# Patient Record
Sex: Female | Born: 1940 | Race: White | Hispanic: No | Marital: Married | Smoking: Never smoker
Health system: Southern US, Community
[De-identification: ages and names within clinical notes are randomized; demographics above are authoritative.]

---

## 2001-04-22 ENCOUNTER — Other Ambulatory Visit: Admission: RE | Admit: 2001-04-22 | Discharge: 2001-04-22 | Payer: Self-pay | Admitting: Orthopedic Surgery

## 2004-12-23 ENCOUNTER — Ambulatory Visit: Payer: Self-pay | Admitting: Internal Medicine

## 2006-04-03 ENCOUNTER — Encounter: Admission: RE | Admit: 2006-04-03 | Discharge: 2006-04-03 | Payer: Self-pay | Admitting: Orthopaedic Surgery

## 2007-04-10 ENCOUNTER — Encounter: Admission: RE | Admit: 2007-04-10 | Discharge: 2007-04-10 | Payer: Self-pay | Admitting: Orthopaedic Surgery

## 2008-04-24 ENCOUNTER — Encounter: Admission: RE | Admit: 2008-04-24 | Discharge: 2008-04-24 | Payer: Self-pay | Admitting: Orthopaedic Surgery

## 2008-05-29 ENCOUNTER — Encounter: Admission: RE | Admit: 2008-05-29 | Discharge: 2008-05-29 | Payer: Self-pay | Admitting: Orthopaedic Surgery

## 2008-06-12 ENCOUNTER — Encounter: Admission: RE | Admit: 2008-06-12 | Discharge: 2008-06-12 | Payer: Self-pay | Admitting: Orthopaedic Surgery

## 2008-06-26 ENCOUNTER — Encounter: Admission: RE | Admit: 2008-06-26 | Discharge: 2008-06-26 | Payer: Self-pay | Admitting: Orthopaedic Surgery

## 2009-02-03 ENCOUNTER — Encounter: Admission: RE | Admit: 2009-02-03 | Discharge: 2009-02-03 | Payer: Self-pay

## 2009-10-19 ENCOUNTER — Ambulatory Visit (HOSPITAL_BASED_OUTPATIENT_CLINIC_OR_DEPARTMENT_OTHER): Admission: RE | Admit: 2009-10-19 | Discharge: 2009-10-19 | Payer: Self-pay | Admitting: Orthopedic Surgery

## 2011-02-14 LAB — POCT HEMOGLOBIN-HEMACUE: Hemoglobin: 12.2 g/dL (ref 12.0–15.0)

## 2011-02-14 LAB — BASIC METABOLIC PANEL
BUN: 34 mg/dL — ABNORMAL HIGH (ref 6–23)
Chloride: 103 mEq/L (ref 96–112)
Creatinine, Ser: 1.6 mg/dL — ABNORMAL HIGH (ref 0.4–1.2)
Glucose, Bld: 89 mg/dL (ref 70–99)
Potassium: 4 mEq/L (ref 3.5–5.1)
Sodium: 139 mEq/L (ref 135–145)

## 2012-02-20 ENCOUNTER — Other Ambulatory Visit: Payer: Self-pay | Admitting: Orthopedic Surgery

## 2012-02-22 NOTE — H&P (Signed)
   Ms. Maners returns for follow-up evaluation of two predicaments affecting her right hand.   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 She has some nail pressure deformity.   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  She is still able to flex from a 5 degree flexion contracture to further flexion 90 degrees. She does have reactive synovitis.    Her past history is updated.  Her family history, social history and review of systems are otherwise unchanged.  We last saw her for evaluation in February of 2012.    X-rays of her right thumb AP and lateral demonstrate advanced osteoarthritis with bone-on-bone arthropathy and large marginal osteophytes.  She likely has several loose bodies.  X-ray of her finger, four views demonstrates bone-on-bone arthropathy with at least a 15 degree ulnar deviation at the PIP joint.  PLAN:  After informed consent and alcohol/Betadine prep she was injected with Depo Medrol and Lidocaine into her ring finger PIP joint with a goal of pain palliation.  With respect to the thumb we discussed debridement  of the mycoid cyst at a mutually convenient time under local anesthesia.  She understands that if she develops pain from her osteoarthritis in the thumb, arthrodesis in the IP joint would be indicated.  Questions were invited and answered in detail.    H&P documentation: 02/23/2012  -History and Physical Reviewed  -Patient has been re-examined  -No change in the plan of care  Wyn Forster, MD

## 2012-02-22 NOTE — Discharge Instructions (Signed)

## 2012-02-23 ENCOUNTER — Ambulatory Visit (HOSPITAL_BASED_OUTPATIENT_CLINIC_OR_DEPARTMENT_OTHER)
Admission: RE | Admit: 2012-02-23 | Discharge: 2012-02-23 | Disposition: A | Discharge status: Home / Self Care | Payer: Medicare Other | Source: Ambulatory Visit | Attending: Orthopedic Surgery | Admitting: Orthopedic Surgery

## 2012-02-23 ENCOUNTER — Encounter (HOSPITAL_BASED_OUTPATIENT_CLINIC_OR_DEPARTMENT_OTHER)
Admission: RE | Disposition: A | Discharge status: Home / Self Care | Payer: Self-pay | Source: Ambulatory Visit | Attending: Orthopedic Surgery

## 2012-02-23 DIAGNOSIS — M6740 Ganglion, unspecified site: Secondary | ICD-10-CM | POA: Insufficient documentation

## 2012-02-23 DIAGNOSIS — M19049 Primary osteoarthritis, unspecified hand: Secondary | ICD-10-CM | POA: Insufficient documentation

## 2012-02-23 HISTORY — PX: MASS EXCISION: SHX2000

## 2012-02-23 SURGERY — MINOR EXCISION OF MASS
Anesthesia: LOCAL | Site: Thumb | Laterality: Right | Wound class: Clean

## 2012-02-23 MED ORDER — HYDROCODONE-ACETAMINOPHEN 5-325 MG PO TABS
ORAL_TABLET | ORAL | Status: AC
Start: 2012-02-23 — End: 2012-03-04

## 2012-02-23 MED ORDER — LIDOCAINE HCL 2 % IJ SOLN
INTRAMUSCULAR | Status: DC | PRN
  Administered 2012-02-23: 5 mL

## 2012-02-23 MED ORDER — CHLORHEXIDINE GLUCONATE 4 % EX LIQD: 60.0000 mL | Freq: Once | CUTANEOUS | Status: DC

## 2012-02-23 MED ORDER — CEPHALEXIN 500 MG PO CAPS: 500.0000 mg | ORAL_CAPSULE | Freq: Three times a day (TID) | ORAL | Status: AC

## 2012-02-23 SURGICAL SUPPLY — 36 items
BANDAGE ADHESIVE 1X3 (GAUZE/BANDAGES/DRESSINGS) IMPLANT
BLADE SURG 15 STRL LF DISP TIS (BLADE) ×1 IMPLANT
BLADE SURG 15 STRL SS (BLADE) ×2
BNDG CMPR 9X4 STRL LF SNTH (GAUZE/BANDAGES/DRESSINGS)
BNDG CMPR MD 5X2 ELC HKLP STRL (GAUZE/BANDAGES/DRESSINGS) ×1
BNDG COHESIVE 1X5 TAN STRL LF (GAUZE/BANDAGES/DRESSINGS) ×1 IMPLANT
BNDG ELASTIC 2 VLCR STRL LF (GAUZE/BANDAGES/DRESSINGS) ×1 IMPLANT
BNDG ESMARK 4X9 LF (GAUZE/BANDAGES/DRESSINGS) IMPLANT
BRUSH SCRUB EZ PLAIN DRY (MISCELLANEOUS) ×2 IMPLANT
CLOTH BEACON ORANGE TIMEOUT ST (SAFETY) ×2 IMPLANT
CORDS BIPOLAR (ELECTRODE) IMPLANT
COVER MAYO STAND STRL (DRAPES) ×2 IMPLANT
CUFF TOURNIQUET SINGLE 18IN (TOURNIQUET CUFF) IMPLANT
DECANTER SPIKE VIAL GLASS SM (MISCELLANEOUS) IMPLANT
DRAIN PENROSE 1/2X12 LTX STRL (WOUND CARE) ×1 IMPLANT
DRAPE SURG 17X23 STRL (DRAPES) ×2 IMPLANT
GAUZE SPONGE 4X4 12PLY STRL LF (GAUZE/BANDAGES/DRESSINGS) ×4 IMPLANT
GAUZE XEROFORM 1X8 LF (GAUZE/BANDAGES/DRESSINGS) ×1 IMPLANT
GLOVE BIO SURGEON STRL SZ 6.5 (GLOVE) ×2 IMPLANT
GLOVE BIOGEL M STRL SZ7.5 (GLOVE) ×2 IMPLANT
GLOVE ORTHO TXT STRL SZ7.5 (GLOVE) ×2 IMPLANT
GOWN PREVENTION PLUS XLARGE (GOWN DISPOSABLE) ×4 IMPLANT
NEEDLE 27GAX1X1/2 (NEEDLE) ×1 IMPLANT
PACK BASIN DAY SURGERY FS (CUSTOM PROCEDURE TRAY) ×2 IMPLANT
PADDING CAST ABS 4INX4YD NS (CAST SUPPLIES) ×1
PADDING CAST ABS COTTON 4X4 ST (CAST SUPPLIES) ×1 IMPLANT
SPONGE GAUZE 4X4 12PLY (GAUZE/BANDAGES/DRESSINGS) ×2 IMPLANT
STOCKINETTE 4X48 STRL (DRAPES) ×2 IMPLANT
SUT ETHILON 5 0 P 3 18 (SUTURE) ×1
SUT NYLON ETHILON 5-0 P-3 1X18 (SUTURE) ×1 IMPLANT
SYR 3ML 23GX1 SAFETY (SYRINGE) IMPLANT
SYR CONTROL 10ML LL (SYRINGE) ×1 IMPLANT
TOWEL OR 17X24 6PK STRL BLUE (TOWEL DISPOSABLE) ×4 IMPLANT
TRAY DSU PREP LF (CUSTOM PROCEDURE TRAY) ×2 IMPLANT
UNDERPAD 30X30 INCONTINENT (UNDERPADS AND DIAPERS) ×2 IMPLANT
WATER STERILE IRR 1000ML POUR (IV SOLUTION) ×2 IMPLANT

## 2012-02-23 NOTE — Op Note (Signed)
OP NOTE DICTATED [id number]

## 2012-02-23 NOTE — Brief Op Note (Signed)
02/23/2012  11:12 AM  PATIENT:  Jacqueline Price  71 y.o. 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

## 2012-02-26 ENCOUNTER — Encounter (HOSPITAL_BASED_OUTPATIENT_CLINIC_OR_DEPARTMENT_OTHER): Payer: Self-pay | Admitting: Orthopedic Surgery

## 2012-02-26 NOTE — Op Note (Signed)
NAMEONESHA, KREBBS NO.:  MEDICAL RECORD NO.:  [id number]  LOCATION:                                 FACILITY:  PHYSICIAN:  Katy Fitch. Terria Deschepper, M.D.      DATE OF BIRTH:  DATE OF PROCEDURE:  02/23/2012 DATE OF DISCHARGE:                              OPERATIVE REPORT   PREOPERATIVE DIAGNOSIS:  Large mass, dorsal radial aspect of right thumb with advanced osteoarthritis of the right thumb interphalangeal joint, radial deviation and bone-on-bone arthropathy.  POSTOPERATIVE DIAGNOSIS:  Mucoid cyst with large marginal osteophytes of the proximal phalangeal head and distal phalangeal base, right thumb interphalangeal joint.  OPERATION:  Arthrotomy of right thumb interphalangeal joint with removal of marginal osteophytes at the proximal phalangeal head, base of distal phalanx, and synovectomy of interphalangeal joint, also incidental excision of mucoid cyst.  OPERATING SURGEON:  Katy Fitch. Junie Avilla, MD  ASSISTANT:  Marveen Reeks Dasnoit, PA-C  ANESTHESIA:  2% lidocaine metacarpal head level block of right thumb. Total volume is 5 mL of 2% lidocaine without epinephrine.  ANESTHETIST:  Katy Fitch. Nicolette Gieske, MD  This was performed as a minor operating room procedure.  PROCEDURE:  Tereza Gilham was brought to room 1 of the [facility] and placed in supine position on the operating table.  Following informed consent and Betadine prep, 2% lidocaine was infiltrated at metacarpal head level to obtain a digital block of the thumb.  After 5 minutes, excellent anesthesia was achieved.  The right arm and hand were prepped with Betadine soap and solution and sterilely draped.  Following routine surgical time-out, the thumb was exsanguinated with a gauze wrap and a half-inch Penrose drain placed over the proximal phalangeal segment as a digital tourniquet.  Procedure commenced with curvilinear incision exposing the mass and mucoid cyst.  A triangular portion of  the capsule between the distal phalanx and proximal phalangeal head was excised between the radial collateral ligament and the extensor tendon.  The mucoid cyst distally was identified and removed with a rongeur.  A capsulotomy was performed revealing a very large radial osteophyte at the proximal phalangeal head and at the base of the distal phalanx.  The marginal osteophytes were resected with a rongeur followed by thorough synovectomy of the interphalangeal joint.  Multiple loose bodies removed.  The joint was then thoroughly irrigated with sterile saline utilizing a 19-gauge blunt dental needle and approximately 25 mL of saline.  Bleeding was not problematic.  The wound was then repaired with trauma sutures of 5-0 nylon.  For aftercare, Ms. Deshong was provided prescriptions for hydrocodone 5 mg 1 p.o. q.4-6 h. p.r.n. pain, 20 tablets without refill; also Keflex 500 mg 1 p.o. q.8 h. x4 days as a prophylactic antibiotic due to joint entry.     Katy Fitch Emaline Karnes, M.D.     RVS/MEDQ  D:  02/23/2012  T:  02/23/2012  Job:  [id number]

## 2012-04-01 ENCOUNTER — Other Ambulatory Visit: Payer: Self-pay | Admitting: Dermatology

## 2014-07-26 ENCOUNTER — Ambulatory Visit (INDEPENDENT_AMBULATORY_CARE_PROVIDER_SITE_OTHER): Payer: 59 | Admitting: Family Medicine

## 2014-07-26 ENCOUNTER — Ambulatory Visit (INDEPENDENT_AMBULATORY_CARE_PROVIDER_SITE_OTHER): Payer: 59

## 2014-07-26 VITALS — BP 130/70 | HR 56 | Temp 97.7°F | Resp 20 | Ht 63.0 in | Wt 158.8 lb

## 2014-07-26 DIAGNOSIS — S01501A Unspecified open wound of lip, initial encounter: Secondary | ICD-10-CM

## 2014-07-26 DIAGNOSIS — S6992XA Unspecified injury of left wrist, hand and finger(s), initial encounter: Secondary | ICD-10-CM

## 2014-07-26 DIAGNOSIS — S6990XA Unspecified injury of unspecified wrist, hand and finger(s), initial encounter: Secondary | ICD-10-CM

## 2014-07-26 DIAGNOSIS — S62329A Displaced fracture of shaft of unspecified metacarpal bone, initial encounter for closed fracture: Secondary | ICD-10-CM

## 2014-07-26 MED ORDER — TRAMADOL HCL 50 MG PO TABS: 50.0000 mg | ORAL_TABLET | Freq: Three times a day (TID) | ORAL | Status: AC | PRN

## 2014-07-26 MED ORDER — METHOCARBAMOL 500 MG PO TABS: 500.0000 mg | ORAL_TABLET | Freq: Four times a day (QID) | ORAL | Status: AC | PRN

## 2014-07-26 NOTE — Patient Instructions (Signed)
Hand Fracture, Metacarpals  Fractures of metacarpals are breaks in the bones of the hand. They extend from the knuckles to the wrist. These bones can undergo many types of fractures. There are different ways of treating these fractures, all of which may be correct.  TREATMENT   Hand fractures can be treated with:    Non-reduction - The fracture is casted without changing the positions of the fracture (bone pieces) involved. This fracture is usually left in a cast for 4 to 6 weeks or as your caregiver thinks necessary.   Closed reduction - The bones are moved back into position without surgery and then casted.   ORIF (open reduction and internal fixation) - The fracture site is opened and the bone pieces are fixed into place with some type of hardware, such as screws, etc. They are then casted.  Your caregiver will discuss the type of fracture you have and the treatment that should be best for that problem. If surgery is chosen, let your caregivers know about the following.   LET YOUR CAREGIVERS KNOW ABOUT:   Allergies.   Medications you are taking, including herbs, eye drops, over the counter medications, and creams.   Use of steroids (by mouth or creams).   Previous problems with anesthetics or novocaine.   Possibility of pregnancy.   History of blood clots (thrombophlebitis).   History of bleeding or blood problems.   Previous surgeries.   Other health problems.  AFTER THE PROCEDURE  After surgery, you will be taken to the recovery area where a nurse will watch and check your progress. Once you are awake, stable, and taking fluids well, barring other problems, you'll be allowed to go home. Once home, an ice pack applied to your operative site may help with pain and keep the swelling down.  HOME CARE INSTRUCTIONS    Follow your caregiver's instructions as to activities, exercises, physical therapy, and driving a car.   Daily exercise is helpful for keeping range of motion and strength. Exercise as  instructed.   To lessen swelling, keep the injured hand elevated above the level of your heart as much as possible.   Apply ice to the injury for 15-20 minutes each hour while awake for the first 2 days. Put the ice in a plastic bag and place a thin towel between the bag of ice and your cast.   Move the fingers of your casted hand several times a day.   If a plaster or fiberglass cast was applied:   Do not try to scratch the skin under the cast using a sharp or pointed object.   Check the skin around the cast every day. You may put lotion on red or sore areas.   Keep your cast dry. Your cast can be protected during bathing with a plastic bag. Do not put your cast into the water.   If a plaster splint was applied:   Wear your splint for as long as directed by your caregiver or until seen again.   Do not get your splint wet. Protect it during bathing with a plastic bag.   You may loosen the elastic bandage around the splint if your fingers start to get numb, tingle, get cold or turn blue.   Do not put pressure on your cast or splint; this may cause it to break. Especially, do not lean plaster casts on hard surfaces for 24 hours after application.   Take medications as directed by your caregiver.   Only   pain. SEEK MEDICAL CARE IF:   Increased bleeding (more than a small spot) from beneath your cast or splint if there is beneath the cast as with an open reduction.  Redness, swelling, or increasing pain in the wound or from beneath your cast or splint.  Pus coming from wound or from beneath your cast or splint.  An unexplained oral temperature above 102 F (38.9 C) develops, or as your caregiver suggests.  A foul smell coming from the wound or dressing or from  beneath your cast or splint.  You have a problem moving any of your fingers. SEEK IMMEDIATE MEDICAL CARE IF:   You develop a rash  You have difficulty breathing  You have any allergy problems If you do not have a window in your cast for observing the wound, a discharge or minor bleeding may show up as a stain on the outside of your cast. Report these findings to your caregiver. MAKE SURE YOU:   Understand these instructions.  Will watch your condition.  Will get help right away if you are not doing well or get worse. Document Released: 10/30/2005 Document Revised: 01/22/2012 Document Reviewed: 06/18/2008 [facility] Patient Information 2015 [place], [place]. This information is not intended to replace advice given to you by your health care provider. Make sure you discuss any questions you have with your health care provider. Metacarpal Fracture   The metacarpal bones are in the middle of the hand, connecting the fingers to the wrist. A metacarpal fracture is a break in one of these bones. It is common for an injury of the hand to break one or more of these bones. A metacarpal fracture of the fifth (little) finger, near the knuckle, is also known as a boxer's fracture. SYMPTOMS   Severe pain at the time of injury.  Pain, tenderness, swelling (especially the back of the hand).  Bruising of the hand within 48 hours.  Visible deformity, if the fracture out of alignment (displaced).  Numbness or paralysis from swelling in the hand, causing pressure on the blood vessels or nerves (uncommon). CAUSES   Direct hit (trauma) to the hand, such as a striking blow with the fist.  Indirect stress to the hand, such as twisting or violent muscle contraction (uncommon). RISK INCREASES WITH:  Contact sports (football, rugby, soccer).  Sports that require hitting (boxing, martial arts).  History of bone or joint disease, including osteoporosis.  Poor hand strength and  flexibility. PREVENTION  Maintain proper conditioning:  Hand and finger strength.  Flexibility and endurance.  For contact sports, wear properly fitted and padded protective equipment for the hand.  Learn and use proper technique when hitting, punching, and landing from a fall. PROGNOSIS If treated properly, metacarpal fractures can be expected to heal within 4 to 6 weeks. For severe injuries, surgery may be needed. RELATED COMPLICATIONS   Fracture does not heal (nonunion).  Heals in a poor position, including twisted fingers (malunion).  Chronic pain, stiffness, or swelling of the hand.  Excessive bleeding in the hand, causing pressure and injury to nerves and blood vessels (rare).  Unstable or arthritic joint, following repeated injury or delayed treatment.  Hindrance of normal hand growth in children.  Infection in open fractures (skin broken over fracture) or at the incision or pin sites, if surgery was performed.  Shortening or injured bones.  Bony bump (spur) or loss of shape of the knuckles. TREATMENT  Treatment will vary, depending on the extent of the injury. First, ice and medicine  will help reduce pain and inflammation. For a single metacarpal fracture that is not displaced and does not involve the joint, restraint is usually sufficient for healing to occur. Multiple metacarpal fractures, fractures that are displaced, or fractures involving the joint may require surgery. Surgery often involves placing pins and screws in the bones, to hold them in place. Restraint of the injury follows surgery, to allow for healing. After restraint (with or without surgery), stretching and strengthening exercises may be needed to regain strength and a full range of motion. Exercises may be done at home or with a therapist. Sometimes, depending on the sport and position, a brace or splint may be needed when first returning to sports. MEDICATION   Do not take pain medicine for 7 days  before surgery.  Only take over-the-counter or prescription medicines for pain, fever, or discomfort as directed by your caregiver.  Prescription pain medicines are usually prescribed only after surgery. Use only as directed and only as much as you need. COLD THERAPY  Cold treatment (icing) should be applied for 10 to 15 minutes every 2 to 3 hours for inflammation and pain, and immediately after activity that aggravates your symptoms. Use ice packs or an ice massage. SEEK IMMEDIATE MEDICAL CARE IF:   Pain, tenderness, or swelling gets worse even with treatment.  You have pain, numbness, or coldness in the hand.  Blue, gray, or dark color appears in the fingernails.  Any of the following occur after surgery:  You have an oral temperature above 102 F (38.9 C), not controlled by medicine.  You have increased pain, swelling, redness, drainage of fluids, or bleeding in the affected area.  New, unexplained symptoms develop. (Drugs used in treatment may produce side effects.) Document Released: 11/13/1998 Document Revised: 01/22/2012 Document Reviewed: 02/11/2009 [facility] Patient Information 2015 [place], [place]. This information is not intended to replace advice given to you by your health care provider. Make sure you discuss any questions you have with your health care provider.

## 2014-07-27 NOTE — Progress Notes (Signed)
Subjective:  This chart was scribed for Jacqueline Sorenson, MD, by Elon Spanner, ED Scribe. This patient was seen on RM 10 and the patient's care was started at 12:51 PM.   Patient ID: Jacqueline Price, female    DOB: 02/07/1941, 73 y.o.   MRN: [id number] Chief Complaint  Patient presents with  . Fall    fall this morning over a small curb.  left wrist/arm pain, right upper arm pain.  lip abrasion.      Fall Pertinent negatives include no headaches or numbness.    HPI Comments: Jacqueline Price is an otherwise healthy 73 y.o. female who presents to [facility] complaining of an upper lip laceration and left hand injury onset 2.5 hours ago.  Patient reports she was walking down the sidewalk when she experienced a mechanical fall, catching herself with her hand and hitting her lip.  Patient reports that she has iced the injury.  Patient denies taking medication for this injury.  Patient denies dental injury.  Patient has a history of fibromyalgia and Sjogren's Syndrome.  Patient has a history of breast cancer eight years ago.  Patient has a history of kidney failure.    Patient denies taking pain medication at home.  Patient is allergic to codeine and demerol.   Patient cannot recall if she has been prescribed Tramadol in the past.    Rheumatologist: Bravo  No past medical history on file. Current Outpatient Prescriptions on File Prior to Visit  Medication Sig Dispense Refill  . doxazosin (CARDURA) 8 MG tablet Take 4 mg by mouth at bedtime.       Marland Kitchen exemestane (AROMASIN) 25 MG tablet Take 25 mg by mouth daily after breakfast.      . furosemide (LASIX) 40 MG tablet Take 40 mg by mouth 2 (two) times daily.       . hydroxychloroquine (PLAQUENIL) 200 MG tablet Take 200 mg by mouth 2 (two) times daily.      . potassium chloride (KLOR-CON) 20 MEQ packet Take 20 mEq by mouth daily.      . predniSONE (DELTASONE) 1 MG tablet Take 1 mg by mouth daily.       Marland Kitchen aspirin 81 MG tablet Take 81 mg by mouth daily.      .  calcium-vitamin D (OSCAL WITH D) 500-200 MG-UNIT per tablet Take 2 tablets by mouth daily.      . carvedilol (COREG) 25 MG tablet Take 25 mg by mouth 2 (two) times daily with a meal.      . fluticasone (FLONASE) 50 MCG/ACT nasal spray Place 2 sprays into the nose daily.      Marland Kitchen spironolactone (ALDACTONE) 25 MG tablet Take 25 mg by mouth daily.      . valsartan (DIOVAN) 320 MG tablet Take 320 mg by mouth daily.       No current facility-administered medications on file prior to visit.   Allergies  Allergen Reactions  . Clindamycin Hcl Swelling  . Codeine Nausea And Vomiting  . Demerol Nausea And Vomiting  . Methacycline Swelling  . Pentazocine Lactate Nausea And Vomiting   Review of Systems  HENT: Negative for dental problem.   Gastrointestinal: Negative for diarrhea.  Musculoskeletal: Positive for arthralgias and joint swelling. Negative for back pain and neck pain.  Skin: Positive for wound.  Neurological: Negative for weakness, numbness and headaches.       Objective:  BP 130/70  Pulse 56  Temp(Src) 97.7 F (36.5 C) (Oral)  Resp  20  Ht  (1.6 m)  Wt 158 lb 12.8 oz (72.031 kg)  BMI 28.14 kg/m2  SpO2 96% Physical Exam  Nursing note and vitals reviewed. Constitutional: She is oriented to person, place, and time. She appears well-developed and well-nourished. No distress.  HENT:  Head: Normocephalic and atraumatic.  Eyes: Conjunctivae and EOM are normal.  Neck: Neck supple. No tracheal deviation present.  Cardiovascular: Normal rate.   Pulmonary/Chest: Effort normal. No respiratory distress.  Musculoskeletal:  Abrasion on inner and outer aspect.  Hemostatic lacerations.  Left hand with moderate swelling worse over 5th metacarpal butt throughout dorsal aspect.  abrasion over palmar aspect of distal 5th metacarpal.  Decreased ROM normal to baseline.  Cap refill normal.  No focal tenderness over fingers.  Decreased flexion in wrist and all MCP's, worse in 4th and fifth.   Normal supponation and pronation.  No pain over distal radius and ulna and over anatomic snuff box   Neurological: She is alert and oriented to person, place, and time.  Skin: Skin is warm and dry.  Psychiatric: She has a normal mood and affect. Her behavior is normal.  1:24 PM Primary X-ray reading by Dr. Clelia Croft: Fracture of the proximal 4th metacarpal, non-displaced.     EXAM: LEFT HAND - COMPLETE 3+ VIEW  COMPARISON: None.  FINDINGS: The joints are aligned. There is an acute obliquely oriented fracture through the proximal third of the fourth metacarpal carpal, without displacement.  There is significant joint space narrowing, osteophyte formation, and irregularity and erosion of the cortical margins at the proximal interphalangeal joint of the left fifth finger. There is surrounding soft tissue swelling.  Lesser degree of osteoarthritis is seen at the interphalangeal joint of the thumb and the distal interphalangeal joint of the second finger.  IMPRESSION: Acute, oblique nondisplaced fracture of the proximal fourth metacarpal.  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  Assessment & Plan:   1:02 PM Dicussed types of pain medications that would work for the patient.  She states she has had a negative reaction including vomiting and nausea from opioids in the past.  She cannot recall if she has tried Tramadol  1:39 PM Patient states she has Flexeril at home which she is going to use for pain.  She has seen a hand surgeon in the past who she would like to follow-up with.  She will call our office with the name and the referral will be placed.  Hand injury, left, initial encounter - Plan: DG Hand Complete Left  Fracture, metacarpal shaft, closed, initial encounter - pt will call for hand surgery referral - seen hand surgeon prior but doesn't remember who so will call with  name.  Placed in ulnar gutter splint - needs seen in 1 wk (Fri or Mon) by hand surg for repeat xray.  Meds ordered this encounter  Medications  . hydrochlorothiazide (MICROZIDE) 12.5 MG capsule    Sig: Take 12.5 mg by mouth daily.  Marland Kitchen amiodarone (PACERONE) 200 MG tablet    Sig: Take 200 mg by mouth daily.  . metoprolol tartrate (LOPRESSOR) 25 MG tablet    Sig: Take 25 mg by mouth 2 (two) times daily.  Marland Kitchen omeprazole (PRILOSEC) 40 MG capsule    Sig: Take 40 mg by mouth daily.  Marland Kitchen amLODipine (NORVASC) 2.5 MG tablet    Sig: Take 2.5 mg by mouth daily.  . traMADol (ULTRAM) 50 MG tablet  Sig: Take 1 tablet (50 mg total) by mouth every 8 (eight) hours as needed.    Dispense:  30 tablet    Refill:  1  . methocarbamol (ROBAXIN) 500 MG tablet    Sig: Take 1 tablet (500 mg total) by mouth 4 (four) times daily as needed for muscle spasms.    Dispense:  20 tablet    Refill:  1    I personally performed the services described in this documentation, which was scribed in my presence. The recorded information has been reviewed and considered, and addended by me as needed.  Jacqueline Sorenson, MD MPH

## 2014-07-29 ENCOUNTER — Telehealth: Payer: Self-pay

## 2014-07-29 NOTE — Telephone Encounter (Signed)
Pt would like a referral to dr Molly Maduro sypher

## 2014-07-30 ENCOUNTER — Other Ambulatory Visit: Payer: Self-pay

## 2014-07-30 DIAGNOSIS — S6992XA Unspecified injury of left wrist, hand and finger(s), initial encounter: Secondary | ICD-10-CM

## 2014-07-30 NOTE — Telephone Encounter (Signed)
Referral entered for pt to see Dr. Josephine Igo

## 2014-12-07 ENCOUNTER — Other Ambulatory Visit: Payer: Self-pay | Admitting: Dermatology

## 2015-09-14 DEATH — deceased

## 2016-08-30 IMAGING — CR DG HAND COMPLETE 3+V*L*
3 series · 3 of 3 positions shown · non-contrast
Comparison: None.

CLINICAL DATA: Pain an swelling over fifth and fourth metacarpals
after fall on outstretched hand.

EXAM:
LEFT HAND - COMPLETE 3+ VIEW

[PA]
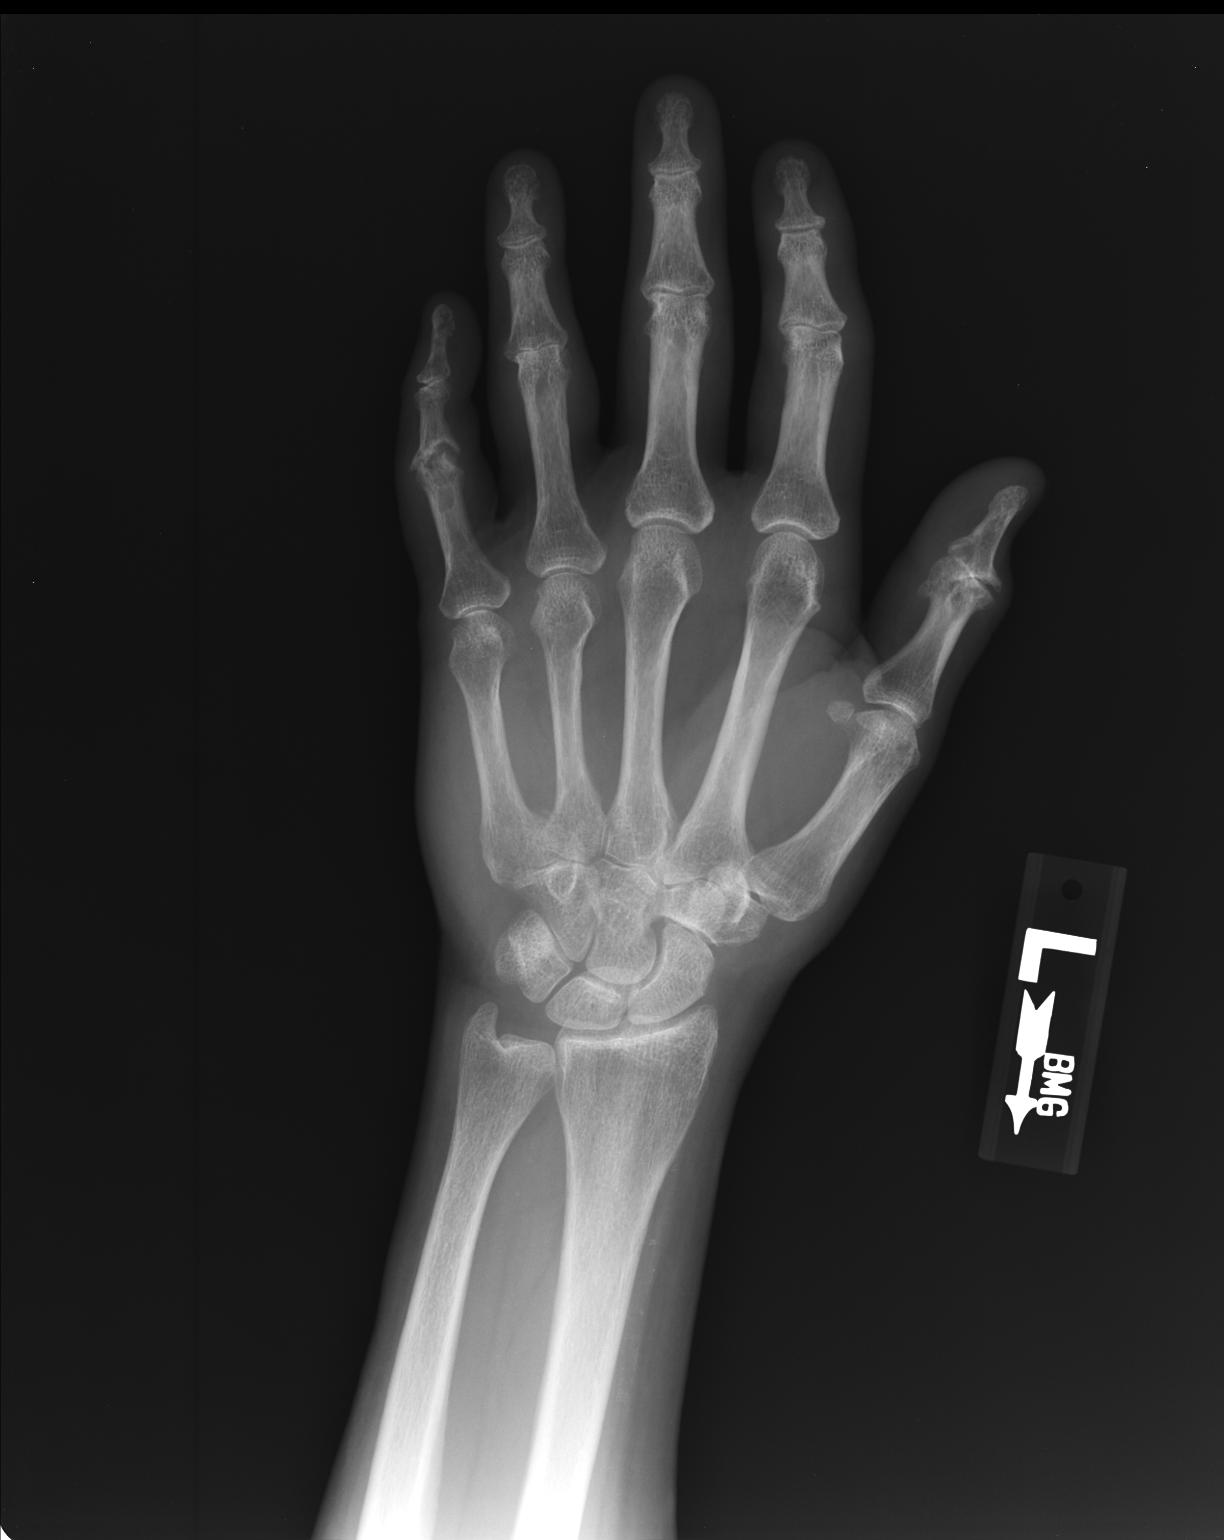

[pa obl]
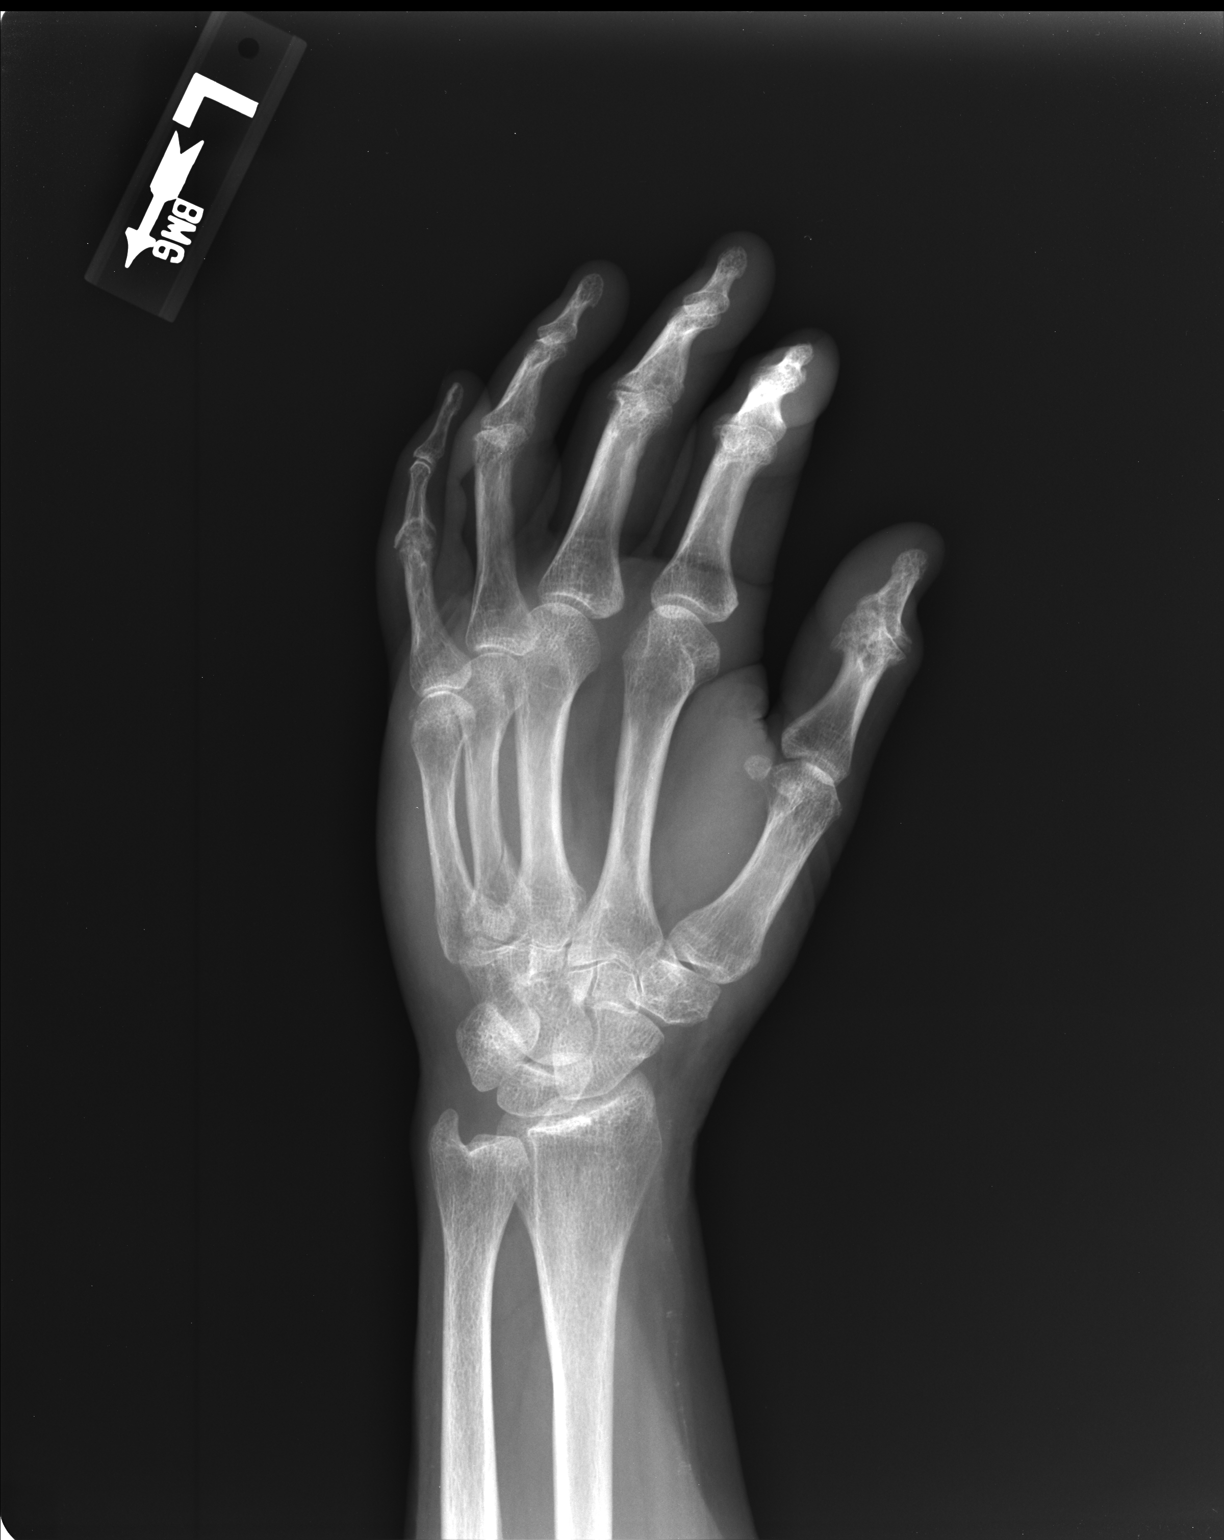

[lateral]
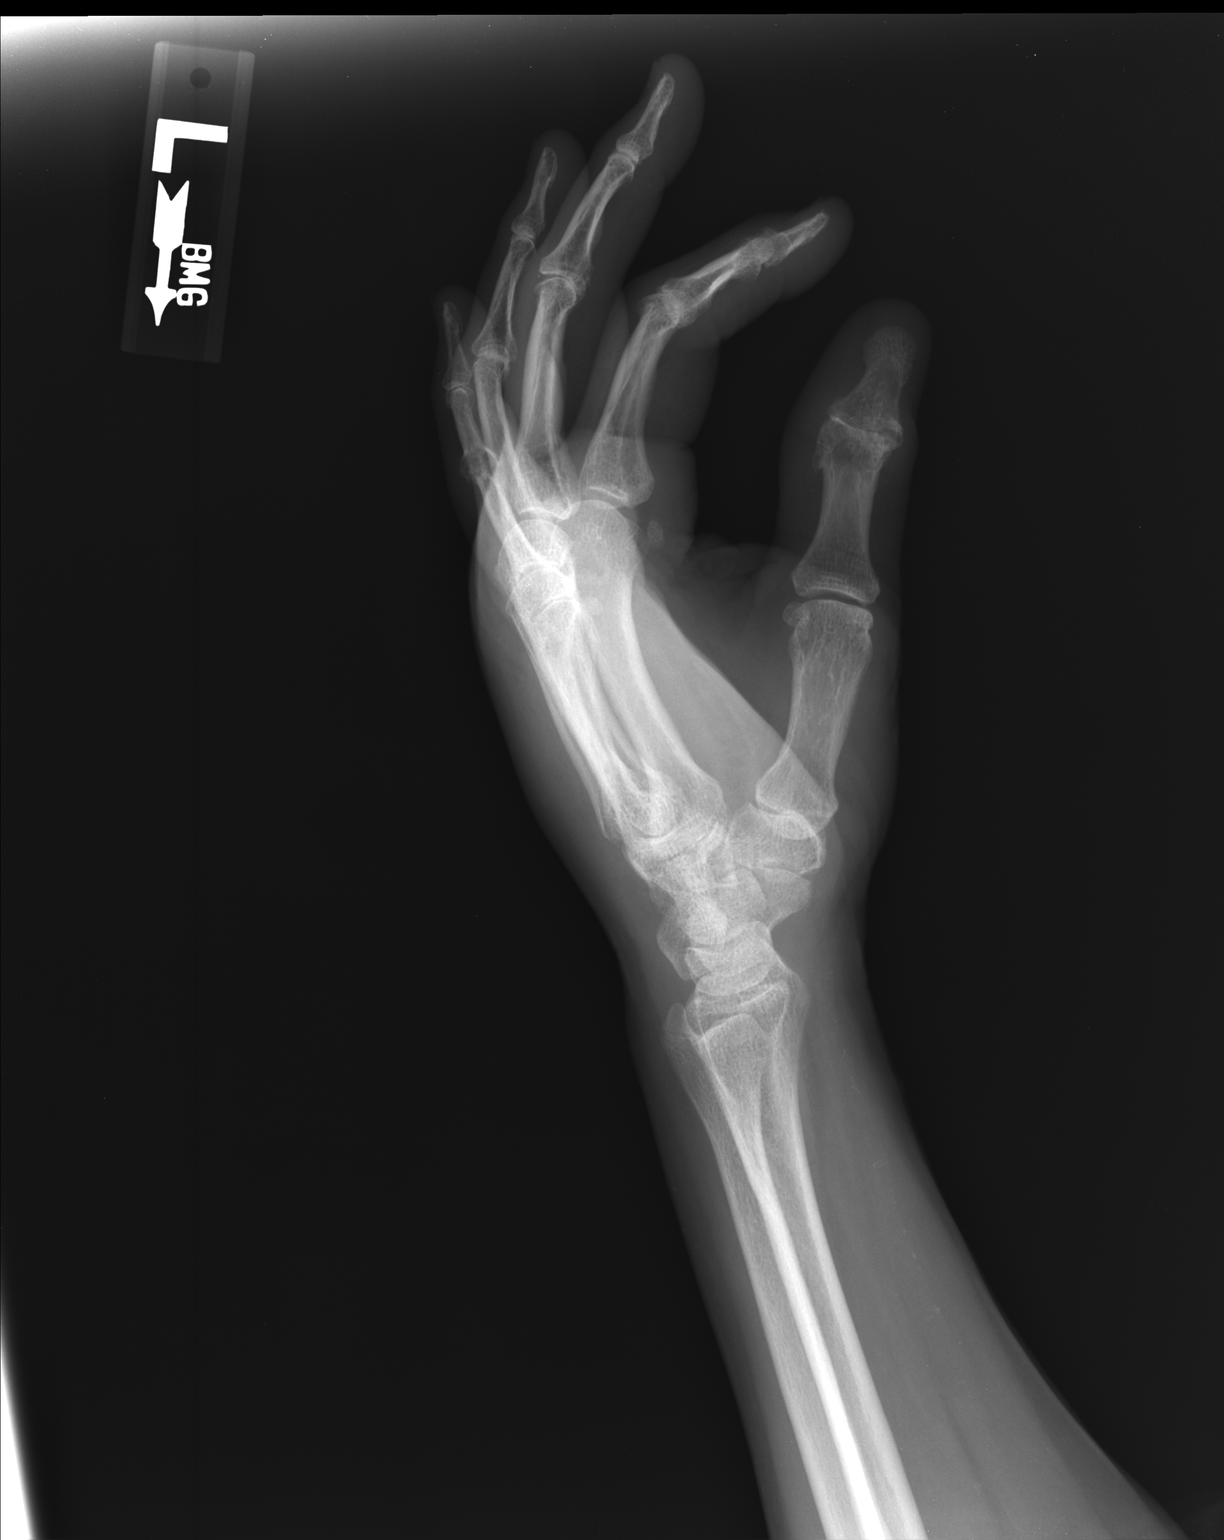

[3 of 3 positions shown; findings below may reference images not displayed]

FINDINGS: The joints are aligned. There is an acute obliquely oriented
fracture through the proximal third of the fourth metacarpal carpal,
without displacement.

There is significant joint space narrowing, osteophyte formation,
and irregularity and erosion of the cortical margins at the proximal
interphalangeal joint of the left fifth finger. There is surrounding
soft tissue swelling.

Lesser degree of osteoarthritis is seen at the interphalangeal joint
of the thumb and the distal interphalangeal joint of the second
finger.
IMPRESSION: Acute, oblique nondisplaced fracture of the proximal fourth
metacarpal.

Osteoarthritis of the fingers as described above, with erosive
osteoarthritis changes at the proximal interphalangeal joint of the
fifth finger.
